# Patient Record
Sex: Male | Born: 2000 | Race: Black or African American | Hispanic: No | Marital: Single | Smoking: Never smoker
Health system: Southern US, Community
[De-identification: ages and names within clinical notes are randomized; demographics above are authoritative.]

---

## 2022-01-20 ENCOUNTER — Ambulatory Visit (INDEPENDENT_AMBULATORY_CARE_PROVIDER_SITE_OTHER): Payer: Medicaid Other

## 2022-01-20 ENCOUNTER — Other Ambulatory Visit: Payer: Self-pay

## 2022-01-20 ENCOUNTER — Ambulatory Visit (HOSPITAL_COMMUNITY)
Admission: EM | Admit: 2022-01-20 | Discharge: 2022-01-20 | Disposition: A | Discharge status: Home / Self Care | Payer: Medicaid Other | Attending: Emergency Medicine | Admitting: Emergency Medicine

## 2022-01-20 ENCOUNTER — Encounter (HOSPITAL_COMMUNITY): Payer: Self-pay | Admitting: *Deleted

## 2022-01-20 DIAGNOSIS — S93402A Sprain of unspecified ligament of left ankle, initial encounter: Secondary | ICD-10-CM | POA: Diagnosis not present

## 2022-01-20 DIAGNOSIS — M25572 Pain in left ankle and joints of left foot: Secondary | ICD-10-CM

## 2022-01-20 MED ORDER — NAPROXEN 500 MG PO TABS: 500.0000 mg | ORAL_TABLET | Freq: Two times a day (BID) | ORAL | 0 refills | Status: AC

## 2022-01-20 NOTE — ED Triage Notes (Signed)
Reports rolling left ankle yesterday while playing basketball.  C/O pain and swelling to left lateral ankle. LLE CMS intact.

## 2022-01-20 NOTE — ED Provider Notes (Signed)
[facility]    CSN: [id number] Arrival date & time: 01/20/22  1610      History   Chief Complaint Chief Complaint  Patient presents with   Ankle Injury    HPI Oscar Maxwell is a 21 y.o. male.   Patient presents with left ankle pain after rolling his ankle last evening while playing basketball.  He denies any prior issues with his ankle.  He states he was able to fully weight-bear after the injury, but he did not continue playing basketball.  He states it is swollen to the lateral aspect of his ankle.  He has not tried any over-the-counter medications.  He iced it 1 time.  He denies any additional concerns today.   Ankle Injury   History reviewed. No pertinent past medical history.  There are no problems to display for this patient.   History reviewed. No pertinent surgical history.     Home Medications    Prior to Admission medications   Medication Sig Start Date End Date Taking? Authorizing Provider  naproxen (NAPROSYN) 500 MG tablet Take 1 tablet (500 mg total) by mouth 2 (two) times daily with a meal for 7 days. 01/20/22 01/27/22 Yes Kortez Murtagh, Jodelle Gross, PA    Family History Family History  Problem Relation Age of Onset   Healthy Mother     Social History Social History   Tobacco Use   Smoking status: Never   Smokeless tobacco: Never  Vaping Use   Vaping Use: Never used  Substance Use Topics   Alcohol use: Not Currently   Drug use: Never     Allergies   Patient has no known allergies.   Review of Systems Review of Systems  Musculoskeletal:  Positive for arthralgias and joint swelling.  All other systems reviewed and are negative.   Physical Exam Triage Vital Signs ED Triage Vitals  Enc Vitals Group     BP 01/20/22 1717 111/74     Pulse Rate 01/20/22 1717 89     Resp 01/20/22 1717 16     Temp 01/20/22 1717 99.1 F (37.3 C)     Temp Source 01/20/22 1717 Oral     SpO2 01/20/22 1717 99 %     Weight --      Height --      Head  Circumference --      Peak Flow --      Pain Score 01/20/22 1719 6     Pain Loc --      Pain Edu? --      Excl. in GC? --    No data found.  Updated Vital Signs BP 111/74    Pulse 89    Temp 99.1 F (37.3 C) (Oral)    Resp 16    SpO2 99%   Visual Acuity Right Eye Distance:   Left Eye Distance:   Bilateral Distance:    Right Eye Near:   Left Eye Near:    Bilateral Near:     Physical Exam Vitals and nursing note reviewed.  Constitutional:      General: He is not in acute distress.    Appearance: Normal appearance. He is normal weight. He is not toxic-appearing.  HENT:     Head: Normocephalic.  Pulmonary:     Effort: Pulmonary effort is normal.  Musculoskeletal:        General: No deformity.     Right lower leg: No edema.     Left lower leg: No edema.  Right ankle: Normal. No swelling, deformity, ecchymosis or lacerations. No tenderness. No CF ligament tenderness. Anterior drawer test negative. Normal pulse.     Right Achilles Tendon: Normal.     Left ankle: Swelling (moderate swelling laterally without ecchymosis) present. No ecchymosis. Tenderness (mild laterally) present over the ATF ligament, AITF ligament and posterior TF ligament. No lateral malleolus tenderness. Normal range of motion. Anterior drawer test negative. Normal pulse.     Left Achilles Tendon: Normal. No tenderness.     Right foot: Normal. Normal range of motion. No swelling, deformity or tenderness.     Left foot: Normal. Normal range of motion. No swelling, deformity, tenderness or bony tenderness.  Skin:    General: Skin is warm.     Findings: No erythema or rash.  Neurological:     Mental Status: He is alert.     UC Treatments / Results  Labs (all labs ordered are listed, but only abnormal results are displayed) Labs Reviewed - No data to display  EKG   Radiology DG Ankle Complete Left  Result Date: 01/20/2022 CLINICAL DATA:  Trauma, pain EXAM: LEFT ANKLE COMPLETE - 3+ VIEW  COMPARISON:  None. FINDINGS: There is no evidence of fracture, dislocation, or joint effusion. There is no evidence of arthropathy or other focal bone abnormality. Soft tissue edema overlying the lateral ankle. IMPRESSION: No fracture or dislocation of the left ankle. Soft tissue edema overlying the lateral ankle. Electronically Signed   By: Jearld Lesch M.D.   On: 01/20/2022 17:38    Procedures Procedures (including critical care time)  Medications Ordered in UC Medications - No data to display  Initial Impression / Assessment and Plan / UC Course  I have reviewed the triage vital signs and the nursing notes.  Pertinent labs & imaging results that were available during my care of the patient were reviewed by me and considered in my medical decision making (see chart for details).     L ankle sprain -ankle brace, NSAIDs, ice, elevation.  Follow-up with PCP or Ortho in 1 to 2 weeks should symptoms persist or worsen.  Final Clinical Impressions(s) / UC Diagnoses   Final diagnoses:  Sprain of left ankle, unspecified ligament, initial encounter     Discharge Instructions      Your ankle xray is negative for a broken bone. You have a stretched tendon, called a sprain.   Please wear the ankle brace for a MINIMUM of two weeks, preferably four weeks. This should be worn at all times other than in the shower or sleeping.  Take naproxen twice a day with food to help with the pain and swelling. Continue to ice your ankle for the next 3 days.  Follow up with PCP or orthopedics if any new or worsening symptoms develop   ED Prescriptions     Medication Sig Dispense Auth. Provider   naproxen (NAPROSYN) 500 MG tablet Take 1 tablet (500 mg total) by mouth 2 (two) times daily with a meal for 7 days. 14 tablet Kaivon Livesey L, [place]      PDMP not reviewed this encounter.   Maretta Bees, [place] 01/20/22 1936

## 2022-01-20 NOTE — Discharge Instructions (Signed)
Your ankle xray is negative for a broken bone. You have a stretched tendon, called a sprain.   Please wear the ankle brace for a MINIMUM of two weeks, preferably four weeks. This should be worn at all times other than in the shower or sleeping.  Take naproxen twice a day with food to help with the pain and swelling. Continue to ice your ankle for the next 3 days.  Follow up with PCP or orthopedics if any new or worsening symptoms develop

## 2022-12-23 IMAGING — DX DG ANKLE COMPLETE 3+V*L*
3 series · 3 of 3 positions shown · non-contrast
Comparison: None.

CLINICAL DATA: Trauma, pain

EXAM:
LEFT ANKLE COMPLETE - 3+ VIEW

[ankle ap]
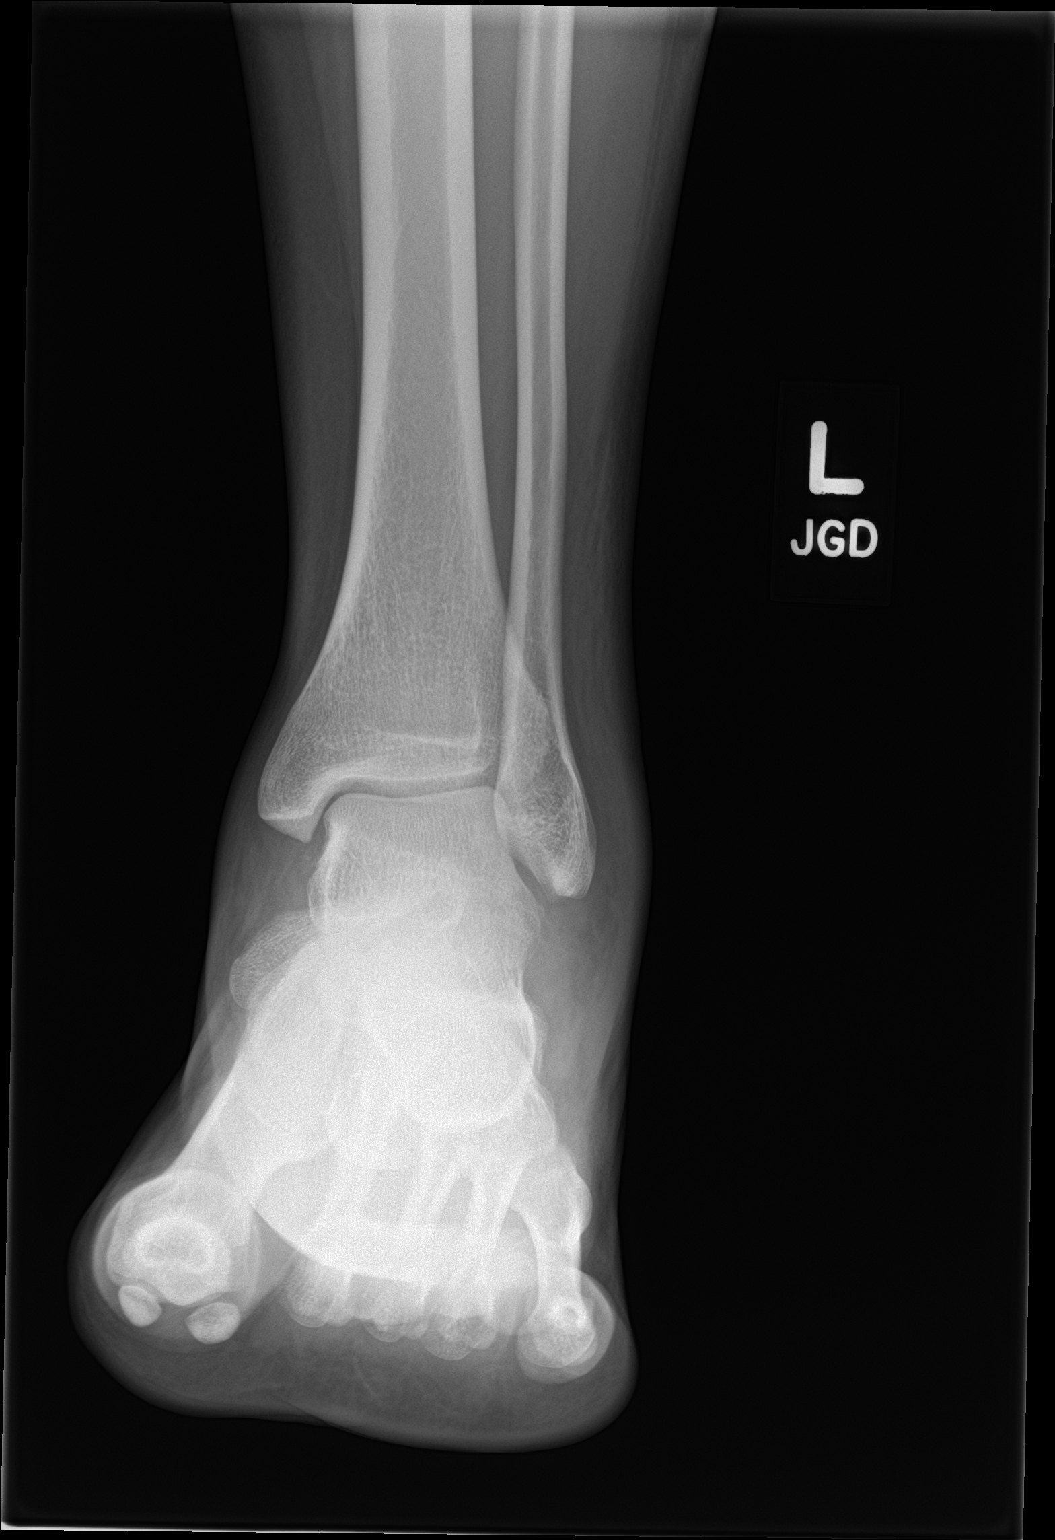

[ankle obl]
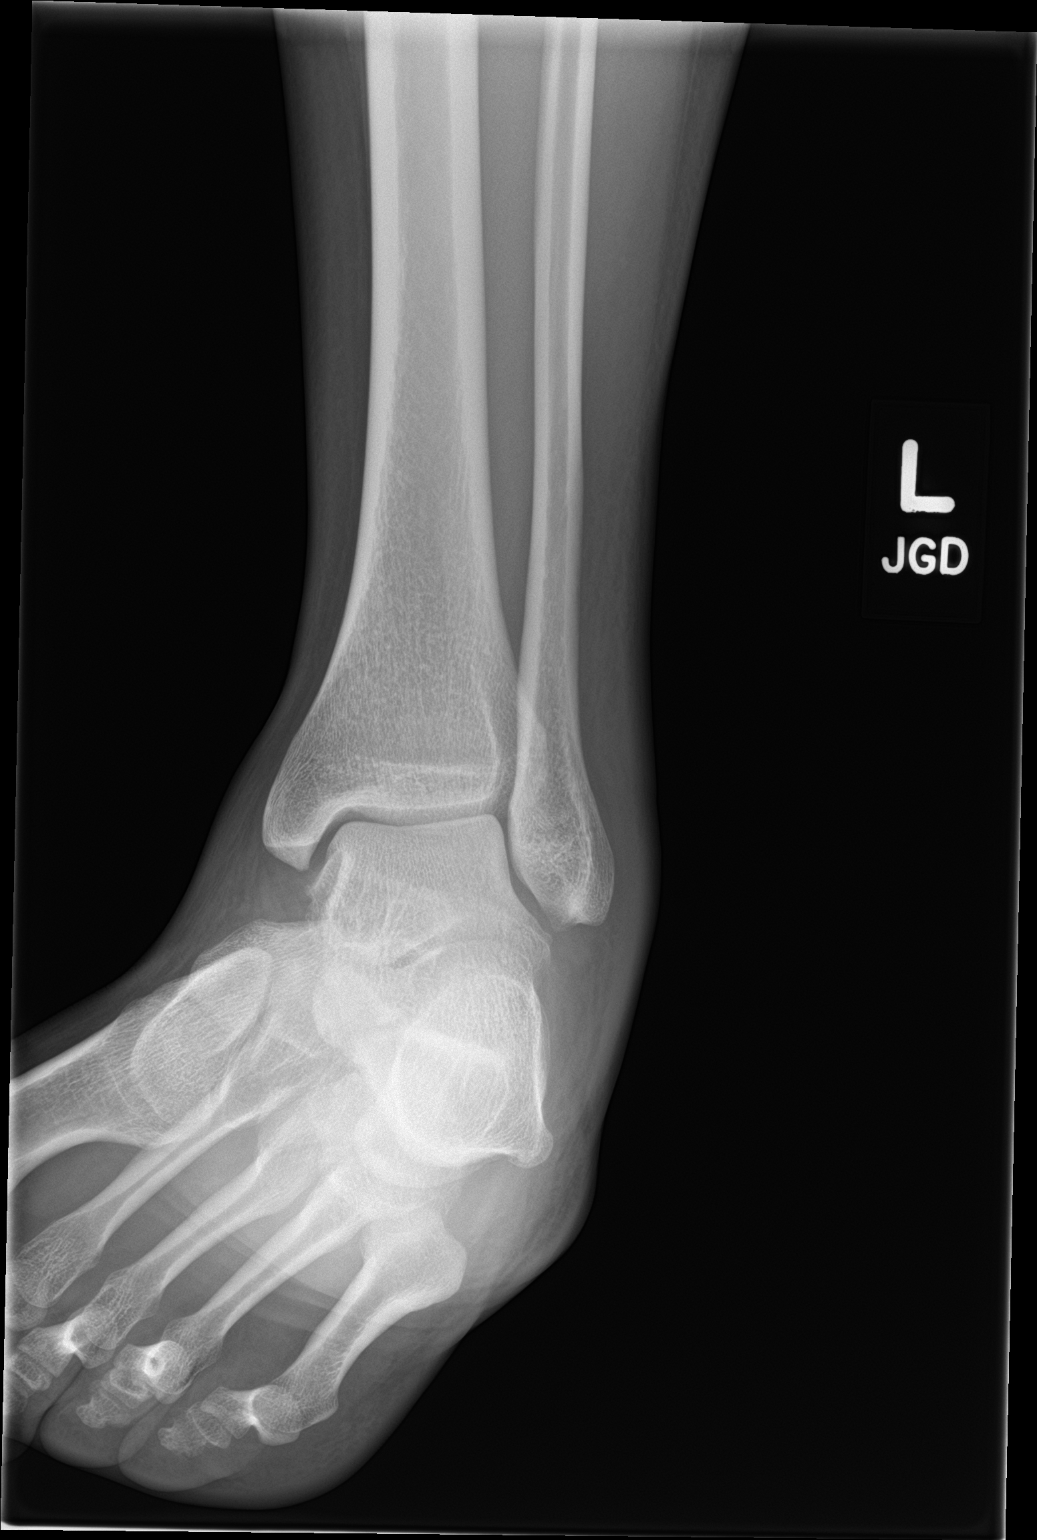

[ankle lat]
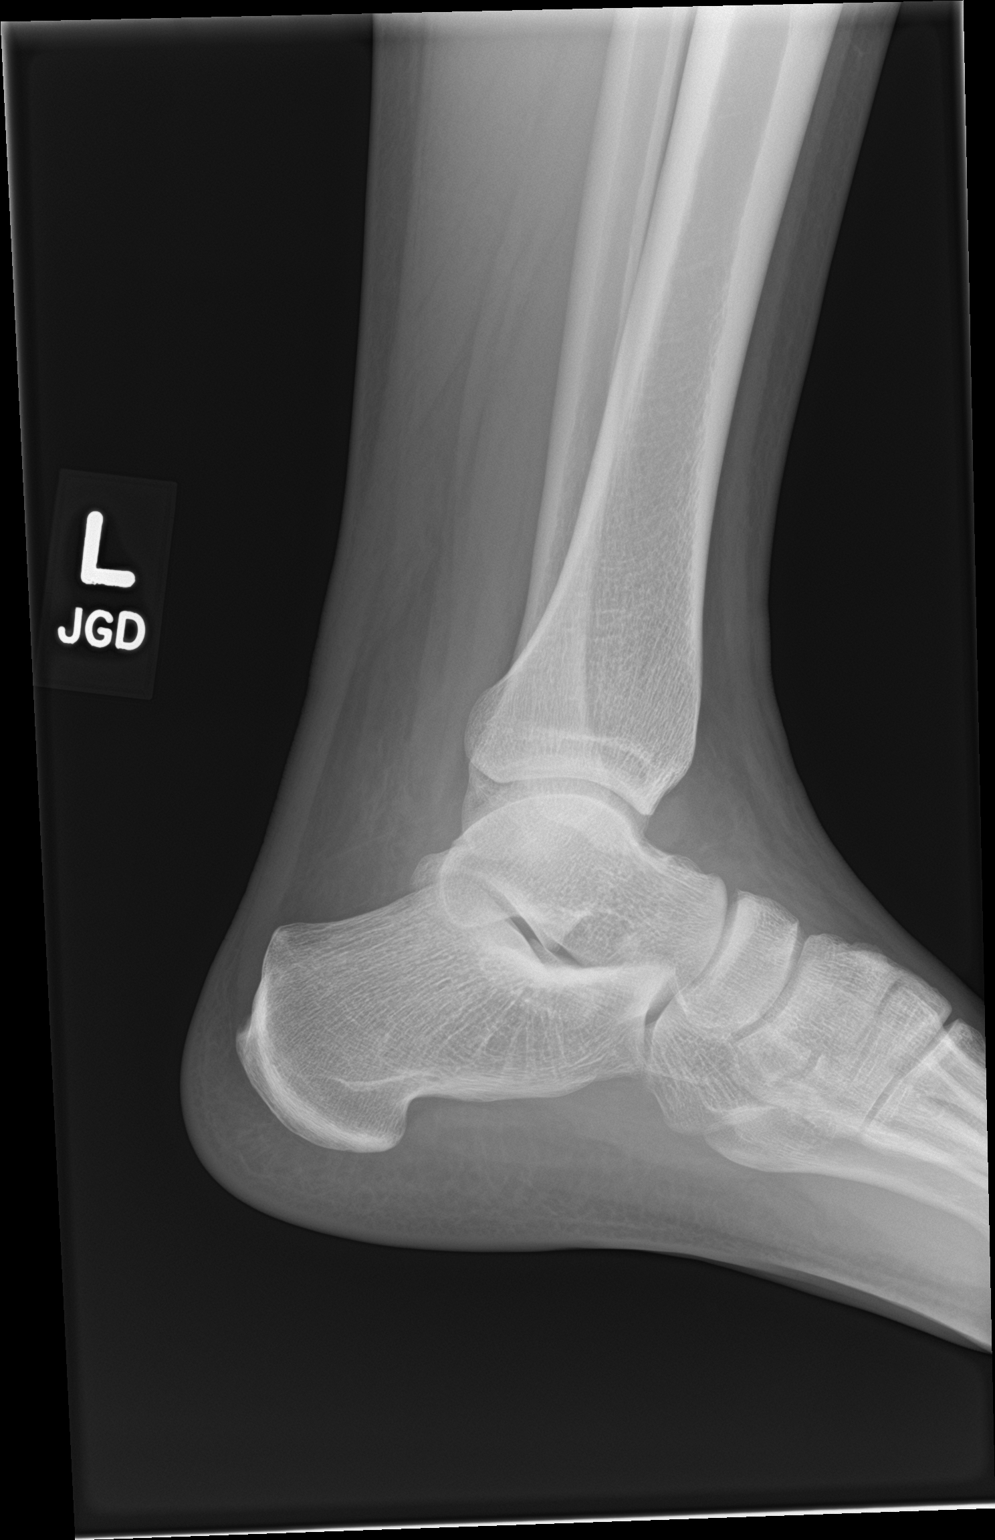

[3 of 3 positions shown; findings below may reference images not displayed]

FINDINGS: There is no evidence of fracture, dislocation, or joint effusion.
There is no evidence of arthropathy or other focal bone abnormality.
Soft tissue edema overlying the lateral ankle.
IMPRESSION: No fracture or dislocation of the left ankle. Soft tissue edema
overlying the lateral ankle.
